# Patient Record
Sex: Female | Born: 1990 | Race: White | Hispanic: Yes | Marital: Single | Smoking: Never smoker
Health system: Southern US, Community
[De-identification: ages and names within clinical notes are randomized; demographics above are authoritative.]

## PROBLEM LIST (undated history)

## (undated) ENCOUNTER — Emergency Department (HOSPITAL_BASED_OUTPATIENT_CLINIC_OR_DEPARTMENT_OTHER): Admission: EM | Payer: Self-pay | Source: Home / Self Care

## (undated) ENCOUNTER — Ambulatory Visit: Payer: Self-pay

## (undated) DIAGNOSIS — Z8759 Personal history of other complications of pregnancy, childbirth and the puerperium: Secondary | ICD-10-CM

## (undated) DIAGNOSIS — Z227 Latent tuberculosis: Secondary | ICD-10-CM

## (undated) DIAGNOSIS — O24419 Gestational diabetes mellitus in pregnancy, unspecified control: Secondary | ICD-10-CM

## (undated) DIAGNOSIS — Z8632 Personal history of gestational diabetes: Secondary | ICD-10-CM

## (undated) HISTORY — DX: Latent tuberculosis: Z22.7

## (undated) HISTORY — DX: Personal history of other complications of pregnancy, childbirth and the puerperium: Z87.59

## (undated) HISTORY — PX: OTHER SURGICAL HISTORY: SHX169

## (undated) HISTORY — PX: NO PAST SURGERIES: SHX2092

## (undated) HISTORY — DX: Personal history of gestational diabetes: Z86.32

---
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
## 2008-09-19 ENCOUNTER — Emergency Department: Payer: Self-pay | Admitting: Unknown Physician Specialty

## 2013-10-09 ENCOUNTER — Emergency Department: Payer: Self-pay | Admitting: Emergency Medicine

## 2015-01-16 ENCOUNTER — Ambulatory Visit: Payer: Self-pay | Admitting: Obstetrics and Gynecology

## 2015-07-10 DIAGNOSIS — Z227 Latent tuberculosis: Secondary | ICD-10-CM | POA: Insufficient documentation

## 2015-09-14 ENCOUNTER — Observation Stay
Admission: EM | Admit: 2015-09-14 | Discharge: 2015-09-14 | Disposition: A | Discharge status: Home / Self Care | Payer: Medicaid Other | Attending: Obstetrics and Gynecology | Admitting: Obstetrics and Gynecology

## 2015-09-14 DIAGNOSIS — Z3A41 41 weeks gestation of pregnancy: Secondary | ICD-10-CM | POA: Diagnosis not present

## 2015-09-14 DIAGNOSIS — O26899 Other specified pregnancy related conditions, unspecified trimester: Secondary | ICD-10-CM

## 2015-09-14 DIAGNOSIS — O26893 Other specified pregnancy related conditions, third trimester: Principal | ICD-10-CM | POA: Insufficient documentation

## 2015-09-14 DIAGNOSIS — R109 Unspecified abdominal pain: Secondary | ICD-10-CM | POA: Insufficient documentation

## 2015-09-14 HISTORY — DX: Gestational diabetes mellitus in pregnancy, unspecified control: O24.419

## 2015-09-14 LAB — OB RESULTS CONSOLE HIV ANTIBODY (ROUTINE TESTING): HIV: NONREACTIVE

## 2015-09-14 LAB — OB RESULTS CONSOLE ANTIBODY SCREEN: Antibody Screen: NEGATIVE

## 2015-09-14 LAB — OB RESULTS CONSOLE RPR: RPR: NONREACTIVE

## 2015-09-14 LAB — OB RESULTS CONSOLE GC/CHLAMYDIA
Chlamydia: NEGATIVE
Gonorrhea: NEGATIVE

## 2015-09-14 LAB — OB RESULTS CONSOLE ABO/RH: RH TYPE: POSITIVE

## 2015-09-14 LAB — OB RESULTS CONSOLE RUBELLA ANTIBODY, IGM: RUBELLA: IMMUNE

## 2015-09-14 LAB — OB RESULTS CONSOLE VARICELLA ZOSTER ANTIBODY, IGG: Varicella: IMMUNE

## 2015-09-14 LAB — OB RESULTS CONSOLE HEPATITIS B SURFACE ANTIGEN: HEP B S AG: NEGATIVE

## 2015-09-14 LAB — OB RESULTS CONSOLE GBS: STREP GROUP B AG: NEGATIVE

## 2015-09-14 NOTE — Discharge Instructions (Signed)
Braxton Hicks Contractions °Contractions of the uterus can occur throughout pregnancy. Contractions are not always a sign that you are in labor.  °WHAT ARE BRAXTON HICKS CONTRACTIONS?  °Contractions that occur before labor are called Braxton Hicks contractions, or false labor. Toward the end of pregnancy (32-34 weeks), these contractions can develop more often and may become more forceful. This is not true labor because these contractions do not result in opening (dilatation) and thinning of the cervix. They are sometimes difficult to tell apart from true labor because these contractions can be forceful and people have different pain tolerances. You should not feel embarrassed if you go to the hospital with false labor. Sometimes, the only way to tell if you are in true labor is for your health care provider to look for changes in the cervix. °If there are no prenatal problems or other health problems associated with the pregnancy, it is completely safe to be sent home with false labor and await the onset of true labor. °HOW CAN YOU TELL THE DIFFERENCE BETWEEN TRUE AND FALSE LABOR? °False Labor °· The contractions of false labor are usually shorter and not as hard as those of true labor.   °· The contractions are usually irregular.   °· The contractions are often felt in the front of the lower abdomen and in the groin.   °· The contractions may go away when you walk around or change positions while lying down.   °· The contractions get weaker and are shorter lasting as time goes on.   °· The contractions do not usually become progressively stronger, regular, and closer together as with true labor.   °True Labor °· Contractions in true labor last 30-70 seconds, become very regular, usually become more intense, and increase in frequency.   °· The contractions do not go away with walking.   °· The discomfort is usually felt in the top of the uterus and spreads to the lower abdomen and low back.   °· True labor can be  determined by your health care provider with an exam. This will show that the cervix is dilating and getting thinner.   °WHAT TO REMEMBER °· Keep up with your usual exercises and follow other instructions given by your health care provider.   °· Take medicines as directed by your health care provider.   °· Keep your regular prenatal appointments.   °· Eat and drink lightly if you think you are going into labor.   °· If Braxton Hicks contractions are making you uncomfortable:   °¨ Change your position from lying down or resting to walking, or from walking to resting.   °¨ Sit and rest in a tub of warm water.   °¨ Drink 2-3 glasses of water. Dehydration may cause these contractions.   °¨ Do slow and deep breathing several times an hour.   °WHEN SHOULD I SEEK IMMEDIATE MEDICAL CARE? °Seek immediate medical care if: °· Your contractions become stronger, more regular, and closer together.   °· You have fluid leaking or gushing from your vagina.   °· You have a fever.   °· You pass blood-tinged mucus.   °· You have vaginal bleeding.   °· You have continuous abdominal pain.   °· You have low back pain that you never had before.   °· You feel your baby's head pushing down and causing pelvic pressure.   °· Your baby is not moving as much as it used to.   °Document Released: 12/16/2005 Document Revised: 12/21/2013 Document Reviewed: 09/27/2013 °Exit[employer]® Patient Information ©2015 [employer]. This information is not intended to replace advice given to you by your health care   provider. Make sure you discuss any questions you have with your health care provider. ° °

## 2015-09-14 NOTE — OB Triage Note (Signed)
Patient complains of contractions since yesterday morning.  Scheduled for IOL at [facility].  Patient went to [facility] last night at 2000 and was sent home.

## 2015-11-01 DIAGNOSIS — Z87448 Personal history of other diseases of urinary system: Secondary | ICD-10-CM | POA: Insufficient documentation

## 2016-06-29 IMAGING — US US OB < 14 WEEKS - US OB TV
1 series · 14 of 28 positions shown · non-contrast
Comparison: None.

CLINICAL DATA: 23-year-old with threatened abortion.

EXAM:
OBSTETRIC <14 WK US AND TRANSVAGINAL OB US
TECHNIQUE: Both transabdominal and transvaginal ultrasound examinations were
performed for complete evaluation of the gestation as well as the
maternal uterus, adnexal regions, and pelvic cul-de-sac.
Transvaginal technique was performed to assess early pregnancy.

[Series 1: us ob < 14 weeks - us ob tv · 0.21mm/px · 14 of 230 slices shown]
[im 9/230]
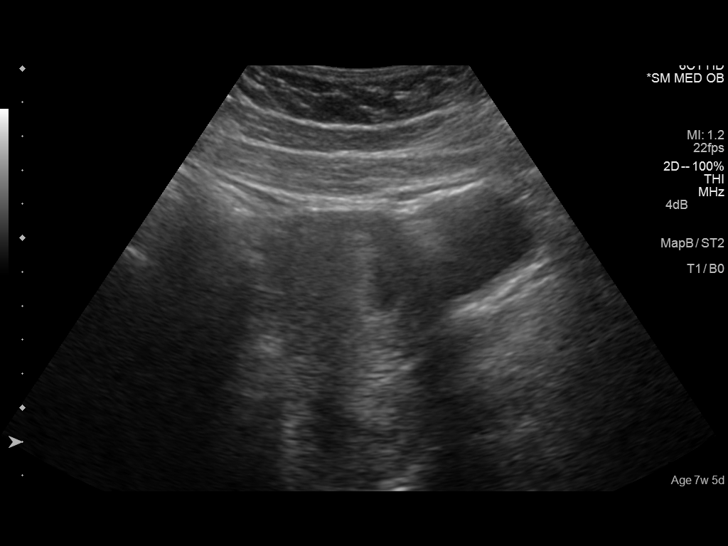
[im 26/230]
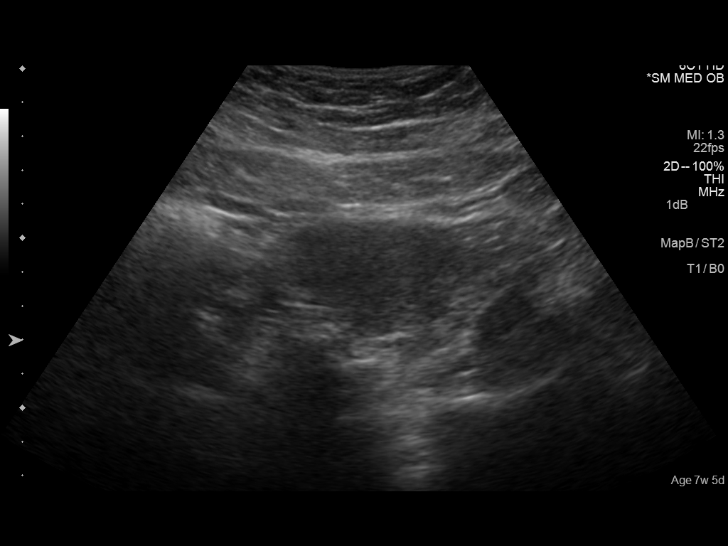
[im 43/230]
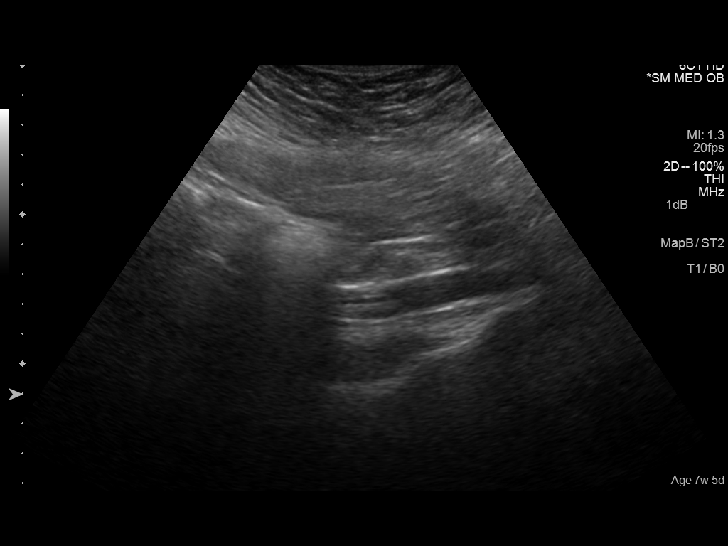
[im 60/230]
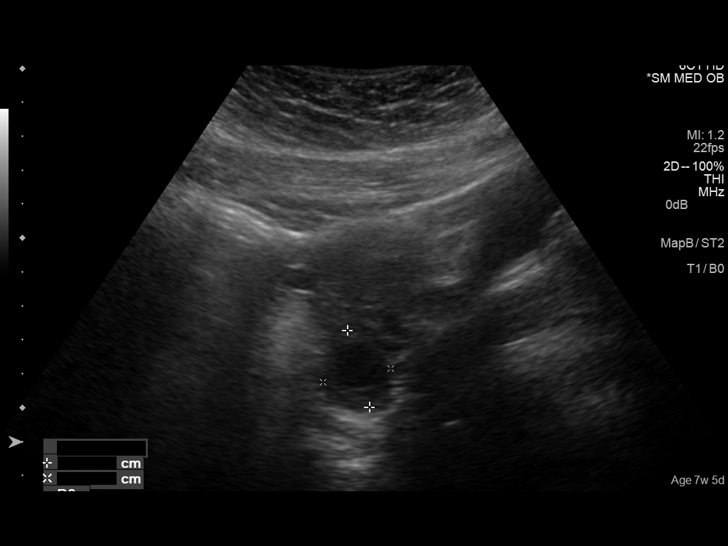
[im 77/230]
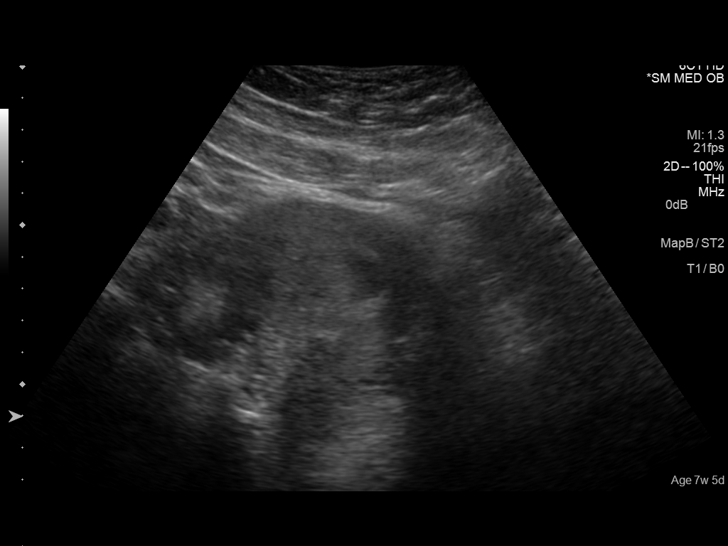
[im 94/230]
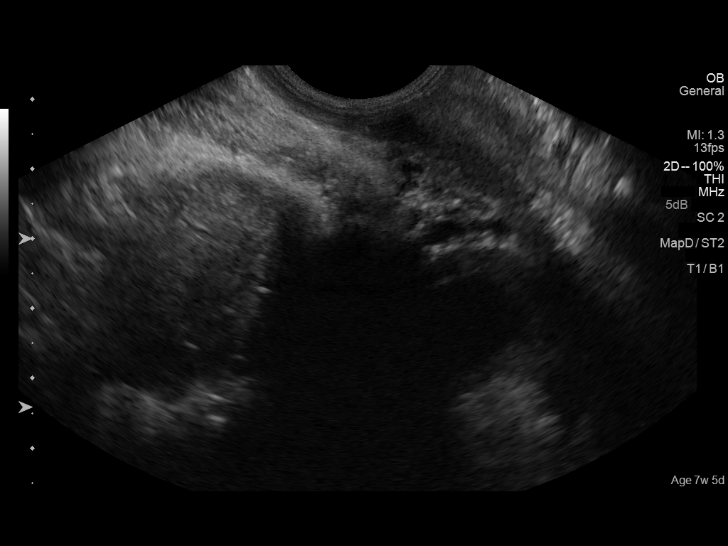
[im 111/230]
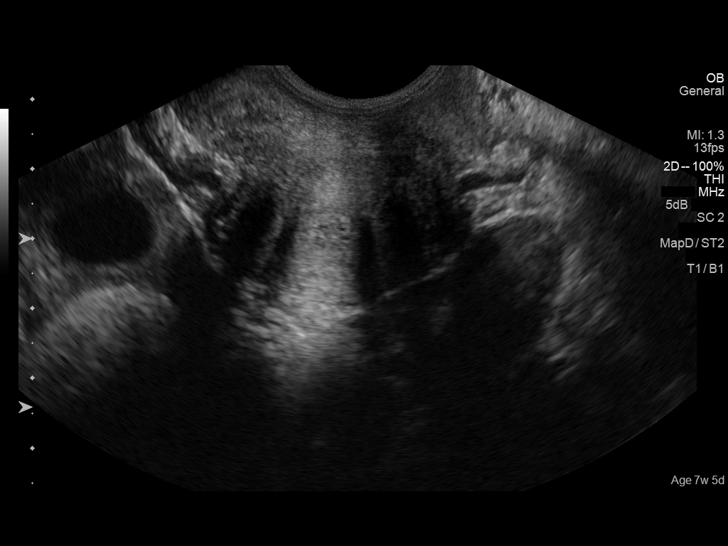
[im 128/230]
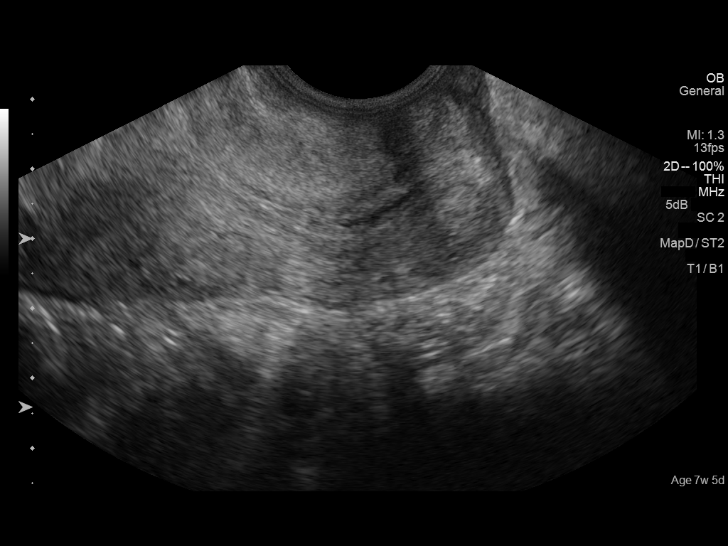
[im 145/230]
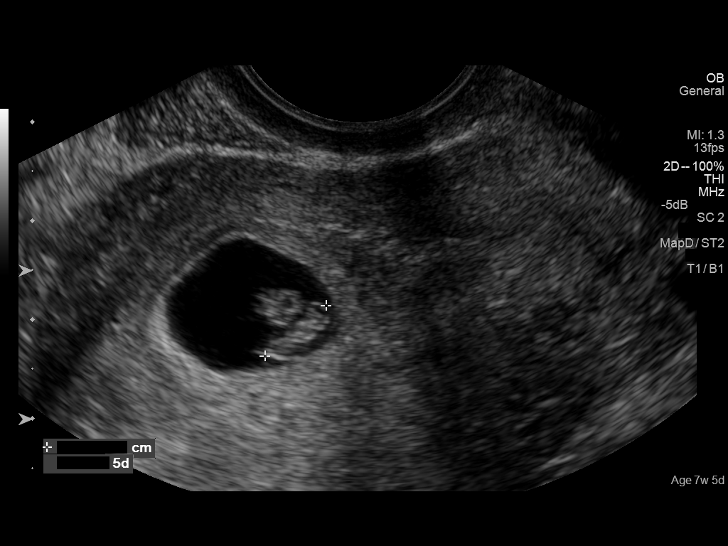
[im 162/230]
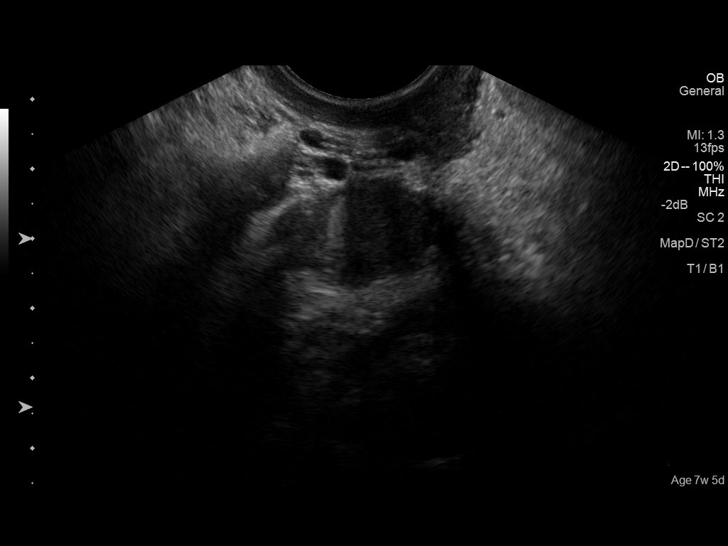
[im 179/230]
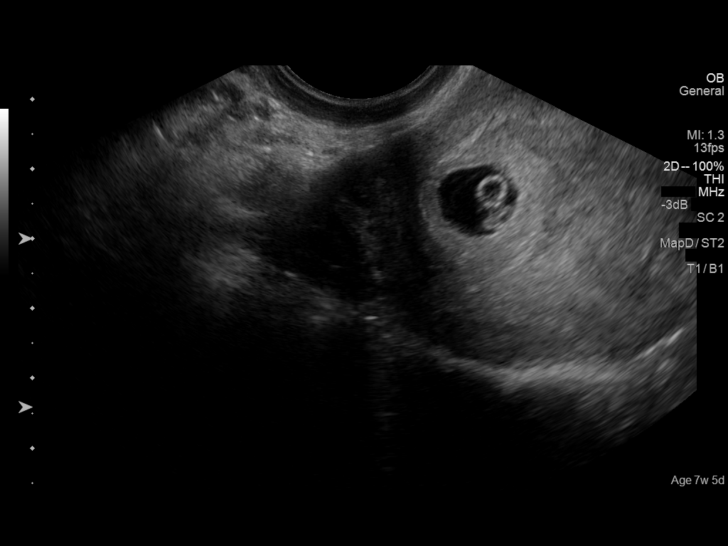
[im 196/230]
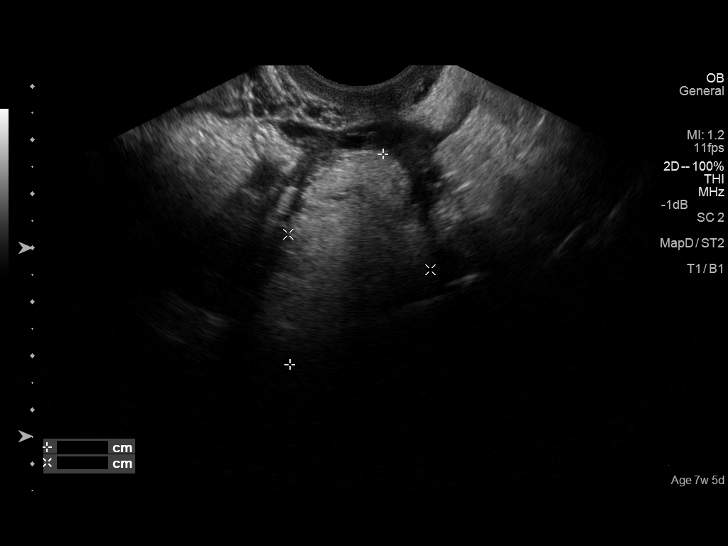
[im 213/230]
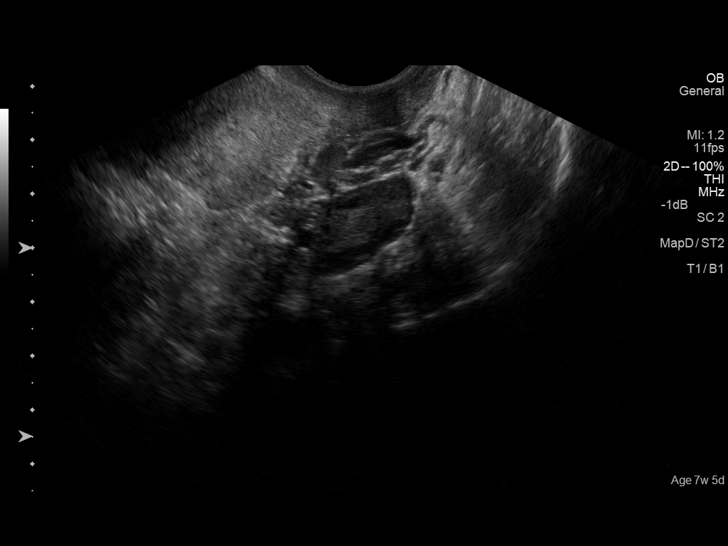
[im 230/230]
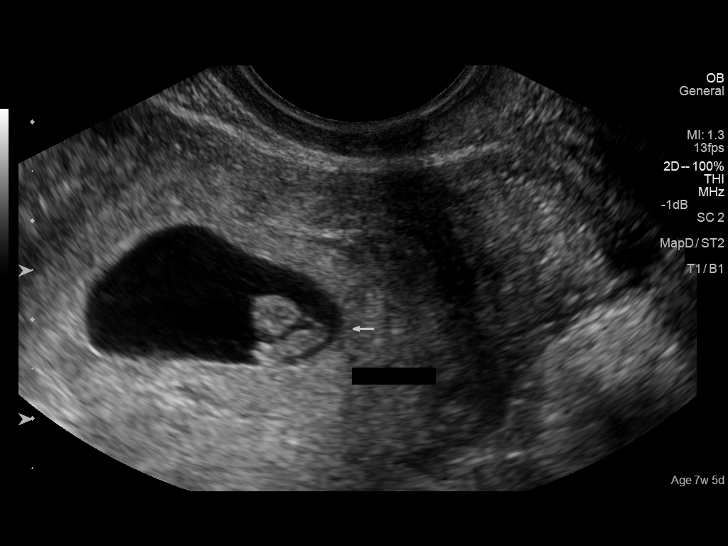

[14 of 28 positions shown; findings below may reference images not displayed]

FINDINGS: Intrauterine gestational sac: Single

Yolk sac:  Single

Embryo:  Single

Cardiac Activity: Yes

Heart Rate:  122 bpm

CRL:   8.1  mm   6 w 5 d                  US EDC: 09/06/2015

Maternal uterus/adnexae:

Subchorionic hemorrhage: None

Right ovary: There is a solid-appearing, uniformly echogenic
structure associated with the right ovary. This measures 4.3 x 2.7 x
2.2 cm.

Left ovary: Normal

Other :None

Free fluid:  None
IMPRESSION: 1. Single living intrauterine gestation with an estimated
gestational age of 6 weeks and 5 days.
2. Echogenic structure within the right ovary is nonspecific but may
reflect presence of ovarian dermoid. Attention on follow-up imaging
recommended.

## 2016-07-19 ENCOUNTER — Encounter (HOSPITAL_COMMUNITY): Payer: Self-pay | Admitting: *Deleted

## 2018-03-02 LAB — HM PAP SMEAR: HM Pap smear: NEGATIVE

## 2019-02-22 LAB — HM HIV SCREENING LAB: HM HIV Screening: NEGATIVE

## 2019-10-07 ENCOUNTER — Other Ambulatory Visit: Payer: Self-pay

## 2019-10-07 ENCOUNTER — Ambulatory Visit (LOCAL_COMMUNITY_HEALTH_CENTER): Payer: Medicaid Other | Admitting: Advanced Practice Midwife

## 2019-10-07 ENCOUNTER — Encounter: Payer: Self-pay | Admitting: Advanced Practice Midwife

## 2019-10-07 VITALS — BP 104/62 | Ht 61.0 in | Wt 172.0 lb

## 2019-10-07 DIAGNOSIS — Z30016 Encounter for initial prescription of transdermal patch hormonal contraceptive device: Secondary | ICD-10-CM | POA: Diagnosis not present

## 2019-10-07 DIAGNOSIS — Z3009 Encounter for other general counseling and advice on contraception: Secondary | ICD-10-CM | POA: Diagnosis not present

## 2019-10-07 DIAGNOSIS — Z87448 Personal history of other diseases of urinary system: Secondary | ICD-10-CM

## 2019-10-07 NOTE — Progress Notes (Addendum)
Here to change birth control methods. Has had continuous vaginal bleeding since receiving last Depo 05/2019. Is considering the Patch for alternate birth control. Declines STD testing. Accepts bloodwork.  Hal Morales, RN

## 2019-10-07 NOTE — Progress Notes (Signed)
   [place] problem visit  [place] Department  Subjective:  Neomia Herbel is a 28 y.o.nonsmoker being seen today for wants to change after 2 DMPA shots to patch for birth control.  Last DMPA 05/2019  Chief Complaint  Patient presents with  . Contraception    Change birth control     HPI  Has had DMPA x2 (last DMPA 05/2019) with spotting/bleeding and wants to change to patch now despite counseling. Last sex 09/07/19 without condom.  LMP ? ("I bleed all the time every day"). Does the patient have a current or past history of drug use? No      Health Maintenance Due  Topic Date Due  . TETANUS/TDAP  07/26/2008  . PAP SMEAR-Modifier  07/26/2010  . INFLUENZA VACCINE  07/31/2019    ROS  The following portions of the patient's history were reviewed and updated as appropriate: allergies, current medications, past family history, past medical history, past social history, past surgical history and problem list. Problem list updated.   See flowsheet for other program required questions.  Objective:   Vitals:   10/07/19 1501  BP: 104/62  Weight: 172 lb (78 kg)  Height: 5\' 1"  (1.549 m)    Physical Exam  n/a  Assessment and Plan:  Rhylei Desena is a 28 y.o. female presenting to the [facility] Department for a Women's Health problem visit  1. Family planning Pt wants to try patch and desires rx--given with refills x 1 year.  Counseled to abstain/condoms next 7 days and to expect spotting/irregular bleeding for next 3 months - Syphilis Serology, [place] [facility] - HIV [place] [facility]  2. Encounter for initial prescription of transdermal patch hormonal contraceptive device Rx given for Xulane apply I patch weekly x 3 weeks and patch free week #4 for menses RF x 1 year     Return if symptoms worsen or fail to improve.  No future appointments.  Herbie Saxon, CNM

## 2020-10-18 ENCOUNTER — Other Ambulatory Visit: Payer: Self-pay | Admitting: Advanced Practice Midwife

## 2020-11-28 ENCOUNTER — Ambulatory Visit (LOCAL_COMMUNITY_HEALTH_CENTER): Payer: Medicaid Other | Admitting: Advanced Practice Midwife

## 2020-11-28 ENCOUNTER — Other Ambulatory Visit: Payer: Self-pay

## 2020-11-28 ENCOUNTER — Encounter: Payer: Self-pay | Admitting: Advanced Practice Midwife

## 2020-11-28 VITALS — BP 107/67 | HR 88 | Temp 98.3°F | Ht 61.0 in | Wt 176.4 lb

## 2020-11-28 DIAGNOSIS — E669 Obesity, unspecified: Secondary | ICD-10-CM | POA: Insufficient documentation

## 2020-11-28 DIAGNOSIS — Z3009 Encounter for other general counseling and advice on contraception: Secondary | ICD-10-CM | POA: Diagnosis not present

## 2020-11-28 DIAGNOSIS — Z30016 Encounter for initial prescription of transdermal patch hormonal contraceptive device: Secondary | ICD-10-CM | POA: Diagnosis not present

## 2020-11-28 LAB — WET PREP FOR TRICH, YEAST, CLUE
Trichomonas Exam: NEGATIVE
Yeast Exam: NEGATIVE

## 2020-11-28 LAB — PREGNANCY, URINE: Preg Test, Ur: NEGATIVE

## 2020-11-28 MED ORDER — ULIPRISTAL ACETATE 30 MG PO TABS: 30.0000 mg | ORAL_TABLET | Freq: Once | ORAL | Status: AC

## 2020-11-28 MED ORDER — XULANE 150-35 MCG/24HR TD PTWK: 1.0000 | MEDICATED_PATCH | TRANSDERMAL | 12 refills | Status: AC

## 2020-11-28 NOTE — Progress Notes (Signed)
UPT and wet mount negative. RN reviewed results on client's return from clinic and allowed her to leave clinic. RN did not know provider had ordered ECP. Phone call made to client that ECP had been e-prescribed by provider to her pharmacy and to pick up / take medicine today. Jossie Ng, RN

## 2020-11-28 NOTE — Progress Notes (Signed)
[facility] [address] Main Number: [phone]    Family Planning Visit- Initial Visit  Subjective:  Anita Haley is a 29 y.o. SHF nonsmoker  [id number] (5,4)   being seen today for an initial well woman visit and to discuss family planning options.  She is currently using None for pregnancy prevention. Patient reports she does not want a pregnancy in the next year.  Patient has the following medical conditions has Inactive TB; History of cystocele; and Obesity BMI=33.3 on their problem list.  Chief Complaint  Patient presents with  . Annual Exam    Patient reports last sex yesterday without condom; with current partner x few weeks; 1 partner in last 3 mo.  LMP 11/15/20.  Last pap 03/02/18 neg.  Wants patch.  Last ETOH 10/27/20 (1 Margarita) "rarely".  Employed 32-40 hours/wk.  Living with her kids.    Patient denies cigs, vaping, cigars  Body mass index is 33.33 kg/m. - Patient is eligible for diabetes screening based on BMI and age >71?  not applicable HA1C ordered? not applicable  Patient reports 1 of partners in last year. Desires STI screening?  Yes  Has patient been screened once for HCV in the past?  No  No results found for: HCVAB  Does the patient have current drug use (including MJ), have a partner with drug use, and/or has been incarcerated since last result? No  If yes-- Screen for HCV through [facility]   Does the patient meet criteria for HBV testing? No  Criteria:  -Household, sexual or needle sharing contact with HBV -History of drug use -HIV positive -Those with known Hep C   Health Maintenance Due  Topic Date Due  . Hepatitis C Screening  Never done  . COVID-19 Vaccine (1) Never done  . INFLUENZA VACCINE  Never done    Review of Systems  All other systems reviewed and are negative.   The following portions of the patient's history were reviewed and updated as appropriate: allergies,  current medications, past family history, past medical history, past social history, past surgical history and problem list. Problem list updated.   See flowsheet for other program required questions.  Objective:   Vitals:   11/28/20 1551  BP: 107/67  Pulse: 88  Temp: 98.3 F (36.8 C)  TempSrc: Oral  Weight: 176 lb 6.4 oz (80 kg)  Height: 5\' 1"  (1.549 m)    Physical Exam Constitutional:      Appearance: Normal appearance. She is obese.  HENT:     Head: Normocephalic and atraumatic.     Mouth/Throat:     Mouth: Mucous membranes are moist.  Eyes:     Conjunctiva/sclera: Conjunctivae normal.  Cardiovascular:     Rate and Rhythm: Normal rate and regular rhythm.  Pulmonary:     Effort: Pulmonary effort is normal.     Breath sounds: Normal breath sounds.  Chest:     Breasts:        Right: Normal.        Left: Normal.  Abdominal:     Palpations: Abdomen is soft.     Comments: Poor tone, soft without tenderness, increased adipose  Genitourinary:    General: Normal vulva.     Exam position: Lithotomy position.     Vagina: Vaginal discharge (white creamy leukorrhea, ph<4.5) present.     Cervix: Normal.     Uterus: Normal.      Adnexa: Right adnexa normal and  left adnexa normal.     Rectum: Normal.  Musculoskeletal:        General: Normal range of motion.     Cervical back: Normal range of motion and neck supple.  Skin:    General: Skin is warm and dry.  Neurological:     Mental Status: She is alert.  Psychiatric:        Mood and Affect: Mood normal.       Assessment and Plan:  Anita Haley is a 29 y.o. female presenting to the [facility] Department for an initial well woman exam/family planning visit  Contraception counseling: Reviewed all forms of birth control options in the tiered based approach. available including abstinence; over the counter/barrier methods; hormonal contraceptive medication including pill, patch, ring, injection,contraceptive  implant, ECP; hormonal and nonhormonal IUDs; permanent sterilization options including vasectomy and the various tubal sterilization modalities. Risks, benefits, and typical effectiveness rates were reviewed.  Questions were answered.  Written information was also given to the patient to review.  Patient desires patch, this was prescribed for patient. She will follow up in 1 year for surveillance.  She was told to call with any further questions, or with any concerns about this method of contraception.  Emphasized use of condoms 100% of the time for STI prevention.  Patient was offered ECP. ECP was accepted by the patient. ECP counseling was given - see RN documentation  1. Family planning Wants ECP today--may give if PT neg today Treat wet mount per standing orders Immunization nurse consult - Pregnancy, urine - HIV [place] [facility] - Syphilis Serology, [place] [facility] - WET PREP FOR TRICH, YEAST, CLUE - Chlamydia/Gonorrhea [place] [facility]  2. Encounter for initial prescription of transdermal patch hormonal contraceptive device Rx given for Xulane patch with RF x 1 year (BP wnl) Please counsel on need for abstinance/back up condoms next  7 days Pt counseled to do PT 12/11/20 and call if +  3. Obesity, unspecified classification, unspecified obesity type, unspecified whether serious comorbidity present      No follow-ups on file.  No future appointments.  Alberteen Spindle, CNM

## 2023-05-24 ENCOUNTER — Other Ambulatory Visit: Payer: Self-pay

## 2024-01-22 ENCOUNTER — Other Ambulatory Visit: Payer: Self-pay

## 2024-01-22 ENCOUNTER — Emergency Department: Payer: No Typology Code available for payment source

## 2024-01-22 ENCOUNTER — Encounter: Payer: Self-pay | Admitting: Emergency Medicine

## 2024-01-22 DIAGNOSIS — S39012A Strain of muscle, fascia and tendon of lower back, initial encounter: Secondary | ICD-10-CM | POA: Insufficient documentation

## 2024-01-22 DIAGNOSIS — Y92410 Unspecified street and highway as the place of occurrence of the external cause: Secondary | ICD-10-CM | POA: Insufficient documentation

## 2024-01-22 DIAGNOSIS — S3992XA Unspecified injury of lower back, initial encounter: Secondary | ICD-10-CM | POA: Diagnosis present

## 2024-01-22 DIAGNOSIS — S8992XA Unspecified injury of left lower leg, initial encounter: Secondary | ICD-10-CM | POA: Diagnosis not present

## 2024-01-22 LAB — POC URINE PREG, ED: Preg Test, Ur: NEGATIVE
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
## 2024-01-23 ENCOUNTER — Emergency Department
Admission: EM | Admit: 2024-01-23 | Discharge: 2024-01-23 | Disposition: A | Discharge status: Home / Self Care | Payer: No Typology Code available for payment source | Attending: Emergency Medicine | Admitting: Emergency Medicine

## 2024-01-23 DIAGNOSIS — S8992XA Unspecified injury of left lower leg, initial encounter: Secondary | ICD-10-CM

## 2024-01-23 DIAGNOSIS — S39012A Strain of muscle, fascia and tendon of lower back, initial encounter: Secondary | ICD-10-CM

## 2024-01-23 MED ORDER — IBUPROFEN 800 MG PO TABS: 800.0000 mg | ORAL_TABLET | Freq: Three times a day (TID) | ORAL | 0 refills | Status: AC | PRN

## 2024-01-23 MED ORDER — HYDROCODONE-ACETAMINOPHEN 5-325 MG PO TABS: 2.0000 | ORAL_TABLET | Freq: Three times a day (TID) | ORAL | 0 refills | Status: AC | PRN

## 2024-01-23 MED ORDER — IBUPROFEN 800 MG PO TABS
800.0000 mg | ORAL_TABLET | Freq: Once | ORAL | Status: AC
  Administered 2024-01-23: 800 mg via ORAL
  Filled 2024-01-23: qty 1

## 2024-01-23 MED ORDER — ONDANSETRON 4 MG PO TBDP: 4.0000 mg | ORAL_TABLET | Freq: Four times a day (QID) | ORAL | 0 refills | Status: AC | PRN

## 2024-01-23 MED ORDER — HYDROCODONE-ACETAMINOPHEN 5-325 MG PO TABS
2.0000 | ORAL_TABLET | Freq: Once | ORAL | Status: AC
  Administered 2024-01-23: 2 via ORAL
  Filled 2024-01-23: qty 2

## 2024-01-23 MED ORDER — ONDANSETRON 4 MG PO TBDP
4.0000 mg | ORAL_TABLET | Freq: Once | ORAL | Status: AC
  Administered 2024-01-23: 4 mg via ORAL
  Filled 2024-01-23: qty 1

## 2024-01-23 NOTE — Discharge Instructions (Addendum)

## 2024-01-23 NOTE — ED Notes (Signed)
Pt demonstrated proper use of crutches.
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
## 2025-01-21 ENCOUNTER — Ambulatory Visit: Payer: Self-pay | Admitting: Family Medicine

## 2025-01-21 ENCOUNTER — Ambulatory Visit

## 2025-01-21 VITALS — BP 124/57 | Ht 62.0 in | Wt 185.5 lb

## 2025-01-21 DIAGNOSIS — Z3200 Encounter for pregnancy test, result unknown: Secondary | ICD-10-CM

## 2025-01-21 DIAGNOSIS — Z3201 Encounter for pregnancy test, result positive: Secondary | ICD-10-CM

## 2025-01-21 DIAGNOSIS — Z309 Encounter for contraceptive management, unspecified: Secondary | ICD-10-CM

## 2025-01-21 LAB — PREGNANCY, URINE: Preg Test, Ur: POSITIVE — AB

## 2025-01-21 NOTE — Progress Notes (Signed)
 UPT positive  Plans on prenatal care at [facility]. Will call to make appt. Sent to clerical for presumptive eligibility. Positive pregnancy packet given to patient and information reviewed. Pt declines PNV today. States she does not like the tablets.
